# Patient Record
Sex: Male | Born: 2000 | Race: Black or African American | Hispanic: No | Marital: Single | Smoking: Never smoker
Health system: Southern US, Community
[De-identification: ages and names within clinical notes are randomized; demographics above are authoritative.]

## PROBLEM LIST (undated history)

## (undated) DIAGNOSIS — J45909 Unspecified asthma, uncomplicated: Secondary | ICD-10-CM

## (undated) HISTORY — PX: SHOULDER ARTHROSCOPY: SHX128

---
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
## 2019-12-09 ENCOUNTER — Ambulatory Visit: Payer: BC Managed Care – PPO | Attending: Internal Medicine

## 2019-12-09 DIAGNOSIS — Z20822 Contact with and (suspected) exposure to covid-19: Secondary | ICD-10-CM

## 2019-12-10 LAB — NOVEL CORONAVIRUS, NAA: SARS-CoV-2, NAA: NOT DETECTED

## 2020-01-12 ENCOUNTER — Ambulatory Visit
Admission: EM | Admit: 2020-01-12 | Discharge: 2020-01-12 | Disposition: A | Discharge status: Home / Self Care | Payer: BC Managed Care – PPO | Attending: Urgent Care | Admitting: Urgent Care

## 2020-01-12 ENCOUNTER — Encounter: Payer: Self-pay | Admitting: Emergency Medicine

## 2020-01-12 ENCOUNTER — Ambulatory Visit (INDEPENDENT_AMBULATORY_CARE_PROVIDER_SITE_OTHER): Payer: BC Managed Care – PPO

## 2020-01-12 ENCOUNTER — Other Ambulatory Visit: Payer: Self-pay

## 2020-01-12 DIAGNOSIS — M79641 Pain in right hand: Secondary | ICD-10-CM

## 2020-01-12 DIAGNOSIS — W458XXA Other foreign body or object entering through skin, initial encounter: Secondary | ICD-10-CM | POA: Diagnosis not present

## 2020-01-12 DIAGNOSIS — S60551A Superficial foreign body of right hand, initial encounter: Secondary | ICD-10-CM | POA: Diagnosis not present

## 2020-01-12 DIAGNOSIS — S6991XA Unspecified injury of right wrist, hand and finger(s), initial encounter: Secondary | ICD-10-CM

## 2020-01-12 MED ORDER — BACITRACIN ZINC 500 UNIT/GM EX OINT: TOPICAL_OINTMENT | Freq: Once | CUTANEOUS | Status: DC

## 2020-01-12 NOTE — ED Triage Notes (Signed)
Patient states he was messing with a wood palette and got a splinter stuck in his right hand on the ring finger today.

## 2020-01-12 NOTE — Discharge Instructions (Addendum)
It was very nice seeing you today in clinic. Thank you for entrusting me with your care.   Keep wound clean and dry. Monitor for signs and symptoms of infection, which would include increased redness, swelling, streaking, drainage, pain, and the development of a fever.   Make arrangements to follow up with your regular doctor in 1 week for re-evaluation if not improving. If your symptoms/condition worsens, please seek follow up care either here or in the ER. Please remember, our [facility] providers are "right here with you" when you need [place].   Again, it was my pleasure to take care of you today. Thank you for choosing our clinic. I hope that you start to feel better quickly.   Quentin Mulling, MSN, APRN, FNP-C, CEN Advanced Practice Provider [facility]

## 2020-01-13 NOTE — ED Provider Notes (Signed)
[place]   Name: Christopher Dunlap DOB: [birth year] MRN: [id number] CSN: [id number] PCP: System, Pcp Not In  Arrival date and time:  01/12/20 1647  Chief Complaint:  Hand Injury   NOTE: Prior to seeing the patient today, I have reviewed the triage nursing documentation and vital signs. Clinical staff has updated patient's PMH/PSHx, current medication list, and drug allergies/intolerances to ensure comprehensive history available to assist in medical decision making.   History:   HPI: Christopher Dunlap is a 19 y.o. male who presents today with complaints of a puncture wound to his RIGHT hand. Patient reports that he was bored at work this afternoon and was messing around. Injury sustained when patient punched a wood pallet causing him to get splints in his hand. Patient with several superficial splinters to the dorsal aspect of his RIGHT hand and a puncture wound to the webspace in between his 3rd and 4th digits. Patient reports that he believes he has a retained splinter in place. He denies pain in the hand unless wound is touched. Mild bleeding noted. Patient cleansed wound prior to arrival. Due to the acute nature of the patient's wound, he has not taken anything to help with the associated pain. Pain is minimal and reported to be 1/10 in severity. Patient reporting that he is UTD on tetanus prophylaxis.   Past Medical History:  Diagnosis Date  . Asthma     Past Surgical History:  Procedure Laterality Date  . SHOULDER ARTHROSCOPY Right     History reviewed. No pertinent family history.  Social History   Tobacco Use  . Smoking status: Never Smoker  . Smokeless tobacco: Never Used  Substance Use Topics  . Alcohol use: No  . Drug use: No    There are no problems to display for this patient.   Home Medications:    No outpatient medications have been marked as taking for the 01/12/20 encounter [facility] Encounter).    Allergies:   Patient has no known allergies.  Review of Systems  (ROS):  Review of systems NEGATIVE unless otherwise noted in narrative H&P section.   Vital Signs: Today's Vitals   01/12/20 1703 01/12/20 1705 01/12/20 1844  BP:  (!) 158/90   Pulse:  78   Resp:  18   Temp:  98.4 F (36.9 C)   TempSrc:  Oral   SpO2:  100%   Weight: 180 lb (81.6 kg)    Height: 5\' 9"  (1.753 m)    PainSc: 1   1     Physical Exam: Physical Exam  Constitutional: He is oriented to person, place, and time and well-developed, well-nourished, and in no distress.  HENT:  Head: Normocephalic and atraumatic.  Eyes: Pupils are equal, round, and reactive to light.  Cardiovascular: Normal rate and intact distal pulses.  Pulmonary/Chest: Effort normal. No respiratory distress.  Musculoskeletal:     Right hand: Tenderness (overlying wound) present. No swelling, deformity or bony tenderness. Normal range of motion. Normal strength. Normal sensation. Normal capillary refill.       Hands:  Neurological: He is alert and oriented to person, place, and time. He has normal sensation and normal strength. Gait normal.  Skin: Skin is warm and dry. No rash noted. He is not diaphoretic.  Psychiatric: Mood, memory, affect and judgment normal.  Nursing note and vitals reviewed.   Urgent Care Treatments / Results:   Orders Placed This Encounter  Procedures  . ED FOREIGN BODY REMOVAL  . DG Hand Complete Right  LABS: PLEASE NOTE: all labs that were ordered this encounter are listed, however only abnormal results are displayed. Labs Reviewed - No data to display  EKG: -None  RADIOLOGY: DG Hand Complete Right  Result Date: 01/12/2020 CLINICAL DATA:  Punched a Pallet. Puncture wound to base of fourth digit. Evaluate for radiopaque foreign body. EXAM: RIGHT HAND - COMPLETE 3+ VIEW COMPARISON:  None. FINDINGS: There is no evidence of fracture or dislocation. There is no evidence of arthropathy or other focal bone abnormality. No radiopaque foreign body. No soft tissue air. Soft  tissues are unremarkable. IMPRESSION: Negative radiographs of the right hand. No radiopaque foreign body or fracture. Please note, wood is not always radiopaque, if there is persistent clinical concern for foreign body, consider ultrasound evaluation. Electronically Signed   By: Narda Rutherford M.D.   On: 01/12/2020 18:02    PROCEDURES: Foreign Body Removal Performed by: Verlee Monte, NP Authorized by: Verlee Monte, NP   Consent:    Consent obtained:  Verbal   Consent given by:  Patient   Risks discussed:  Bleeding, infection, pain and incomplete removal   Alternatives discussed:  Observation, alternative treatment and delayed treatment Location:    Location:  Hand   Hand location:  R hand dorsum   Depth:  Intradermal   Tendon involvement:  None Pre-procedure details:    Imaging:  X-ray   Neurovascular status: intact   Anesthesia (see MAR for exact dosages):    Anesthesia method:  None Procedure type:    Procedure complexity:  Simple Procedure details:    Localization method:  Visualized   Dissection of underlying tissues: no     Bloodless field: no     Removal mechanism:  Forceps and irrigation   Foreign bodies recovered:  3   Description:  Small splinters of wood   Intact foreign body removal: yes   Post-procedure details:    Neurovascular status: intact     Confirmation:  No additional foreign bodies on visualization   Skin closure:  None   Dressing:  Antibiotic ointment and non-adherent dressing   Patient tolerance of procedure:  Tolerated well, no immediate complications    MEDICATIONS RECEIVED THIS VISIT: Medications - No data to display  PERTINENT CLINICAL COURSE NOTES/UPDATES:   Initial Impression / Assessment and Plan / Urgent Care Course:  Pertinent labs & imaging results that were available during my care of the patient were personally reviewed by me and considered in my medical decision making (see lab/imaging section of note for values and  interpretations).  Christopher Dunlap is a 19 y.o. male who presents to [facility] today with complaints of Hand Injury  Patient is well appearing overall in clinic today. He does not appear to be in any acute distress. Presenting symptoms (see HPI) and exam as documented above Patient with concern about retained FB in his RIGHT hand after punching a wood pallet today. Wound cleansed and explored with the use of forceps. Several small splinters removed. Patient remains concerned about potential for deeper retained FB. Discussed that FB may or may not show up on diagnostic radiographs. Reviewed that wood is not always radiopaque. Patient wishes to proceed with radiographs.   Diagnostic radiographs of the RIGHT hand revealed no acute abnormalities; no fracture, dislocation, or effusion. There were no radiopaque foreign bodies noted on the study. Radiologist noted that if clinical concern remained, US imaging of the soft tissues could be considered. Results reviewed with patient. Wound explored a second time  with assistance from clinic nursing staff. No further FB identified on exam. Discussed plans for conservative management at this point. Patient to monitor area for the next few days. If not improving, we can consider proceeding with the US imaging. Wound cleansed and dressed by CMA; bacitracin applied. Patient educated on need for daily wound care. He was encouraged to keep wound clean and dry. He was instructed to apply antibiotic ointment 1-2 times daily. Wound may be left open to air while at home, however he was encouraged to cover area while in public to prevent infection. Patient to monitor for signs and symptoms of infection, which would include increased redness, swelling, streaking, drainage, pain, and the development of a fever. May use Tylenol and/or Ibuprofen as needed for discomfort.   Discussed follow up with primary care physician in 1 week for re-evaluation. I have reviewed the follow up  and strict return precautions for any new or worsening symptoms. Patient is aware of symptoms that would be deemed urgent/emergent, and would thus require further evaluation either here or in the emergency department. At the time of discharge, he verbalized understanding and consent with the discharge plan as it was reviewed with him. All questions were fielded by provider and/or clinic staff prior to patient discharge.    Final Clinical Impressions / Urgent Care Diagnoses:   Final diagnoses:  Injury of right hand, initial encounter  Splinter of right hand    New Prescriptions:  [place] Controlled Substance Registry consulted? Not Applicable  Meds ordered this encounter  Medications  . bacitracin ointment    Recommended Follow up Care:  Patient encouraged to follow up with the following provider within the specified time frame, or sooner as dictated by the severity of his symptoms. As always, he was instructed that for any urgent/emergent care needs, he should seek care either here or in the emergency department for more immediate evaluation.  NOTE: This note was prepared using Lobbyist along with smaller Company secretary. Despite my best ability to proofread, there is the potential that transcriptional errors may still occur from this process, and are completely unintentional.    Karen Kitchens, NP 01/13/20 [phone]

## 2022-01-28 IMAGING — CR DG HAND COMPLETE 3+V*R*
3 series · 3 of 3 positions shown · non-contrast
Comparison: None.

CLINICAL DATA: Punched a Pallet. Puncture wound to base of fourth
digit. Evaluate for radiopaque foreign body.

EXAM:
RIGHT HAND - COMPLETE 3+ VIEW

[hand ap]
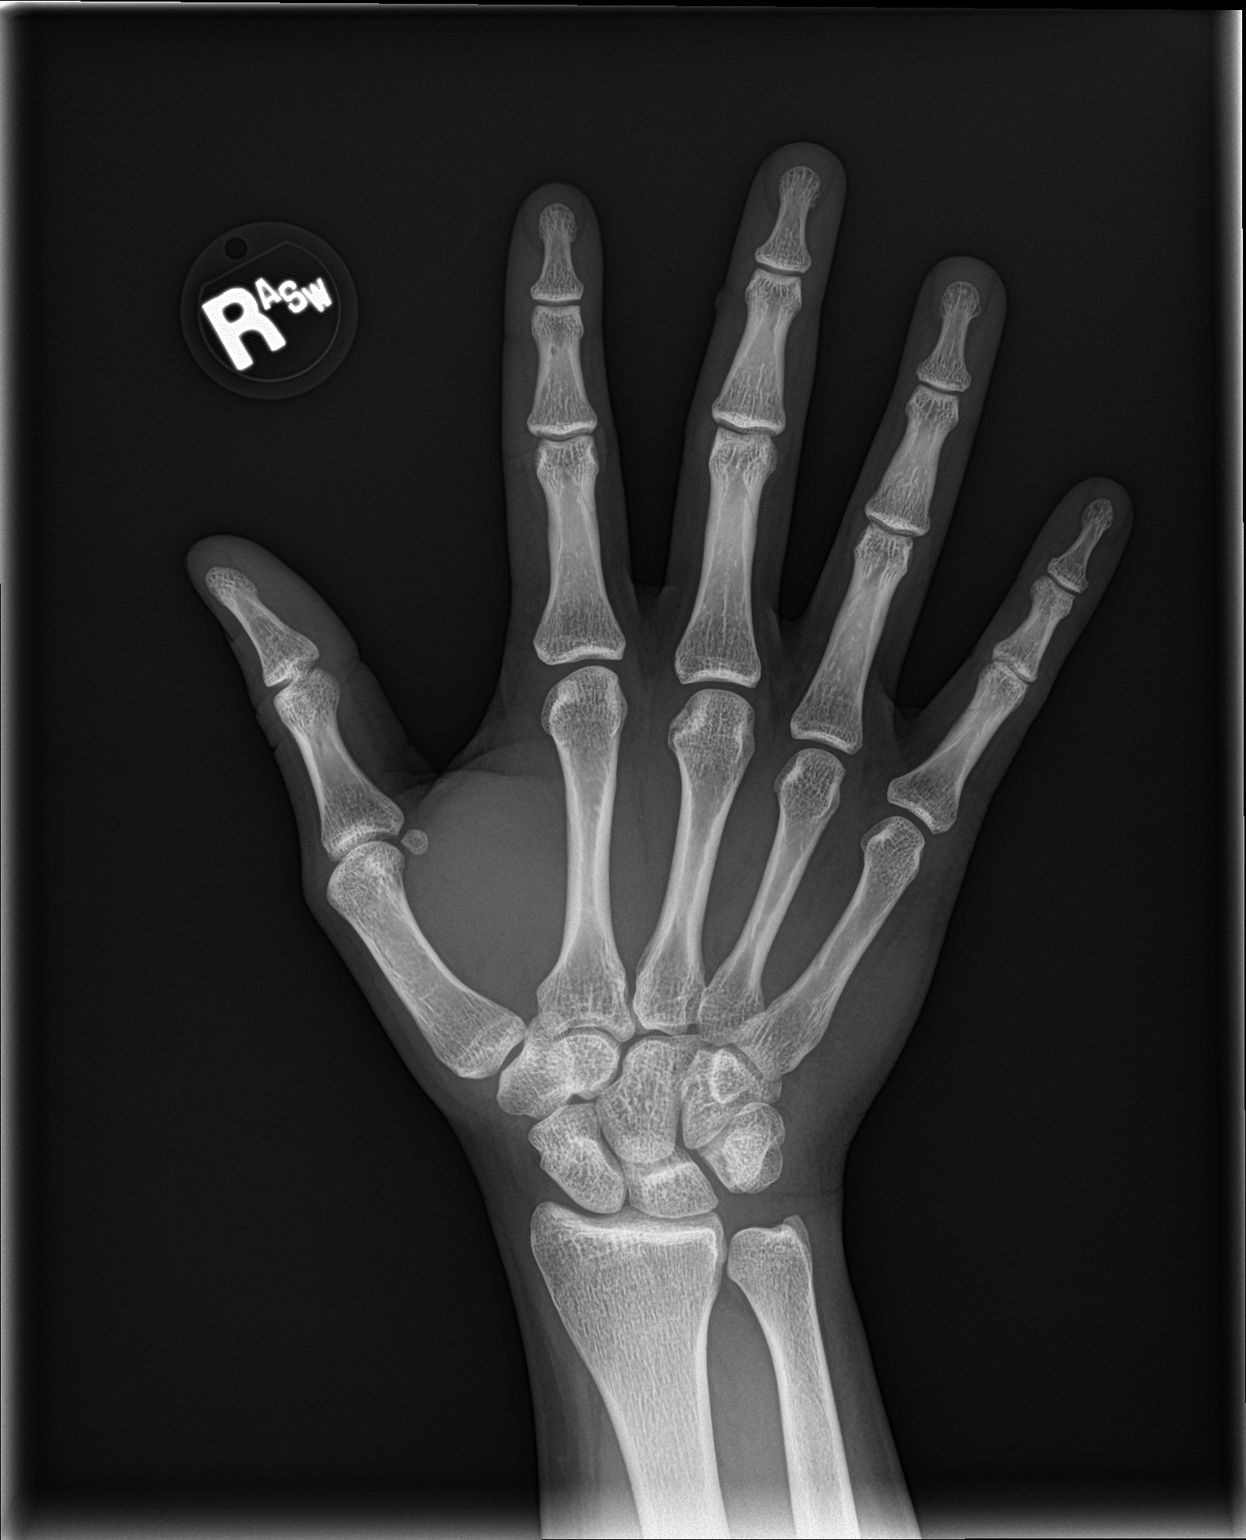

[hand obl]
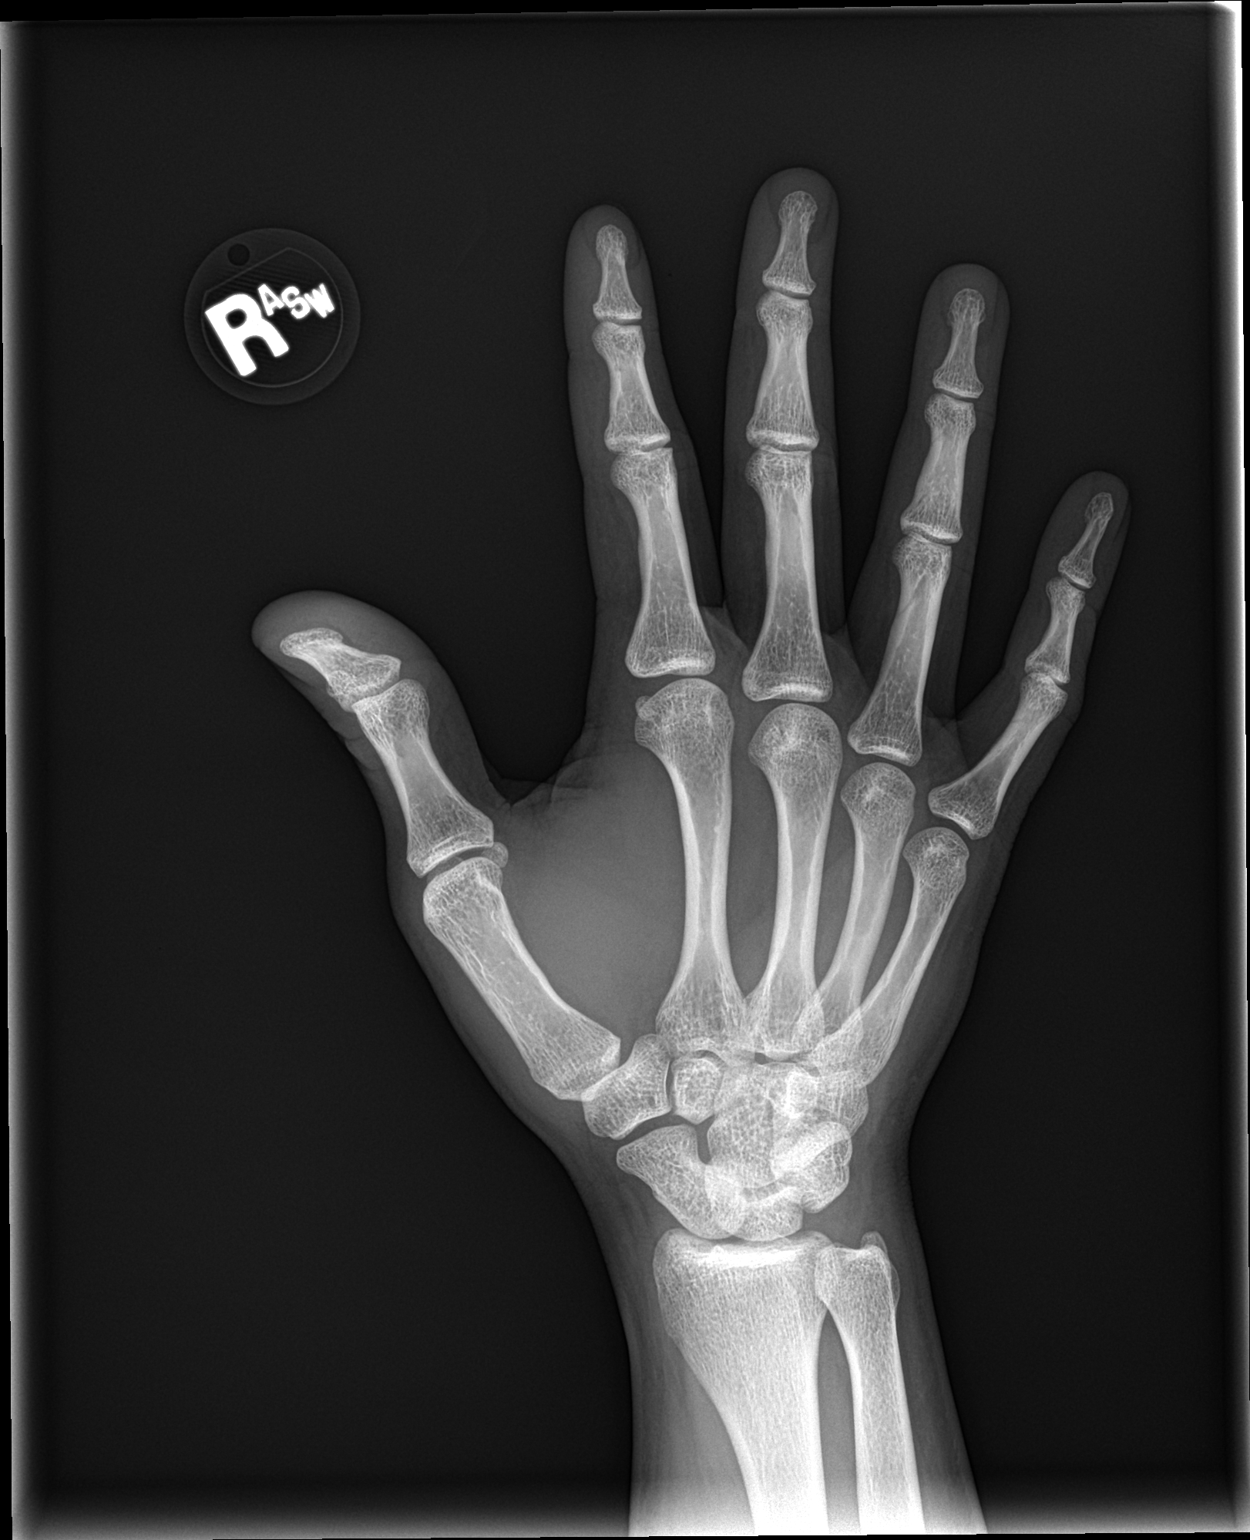

[hand lat]
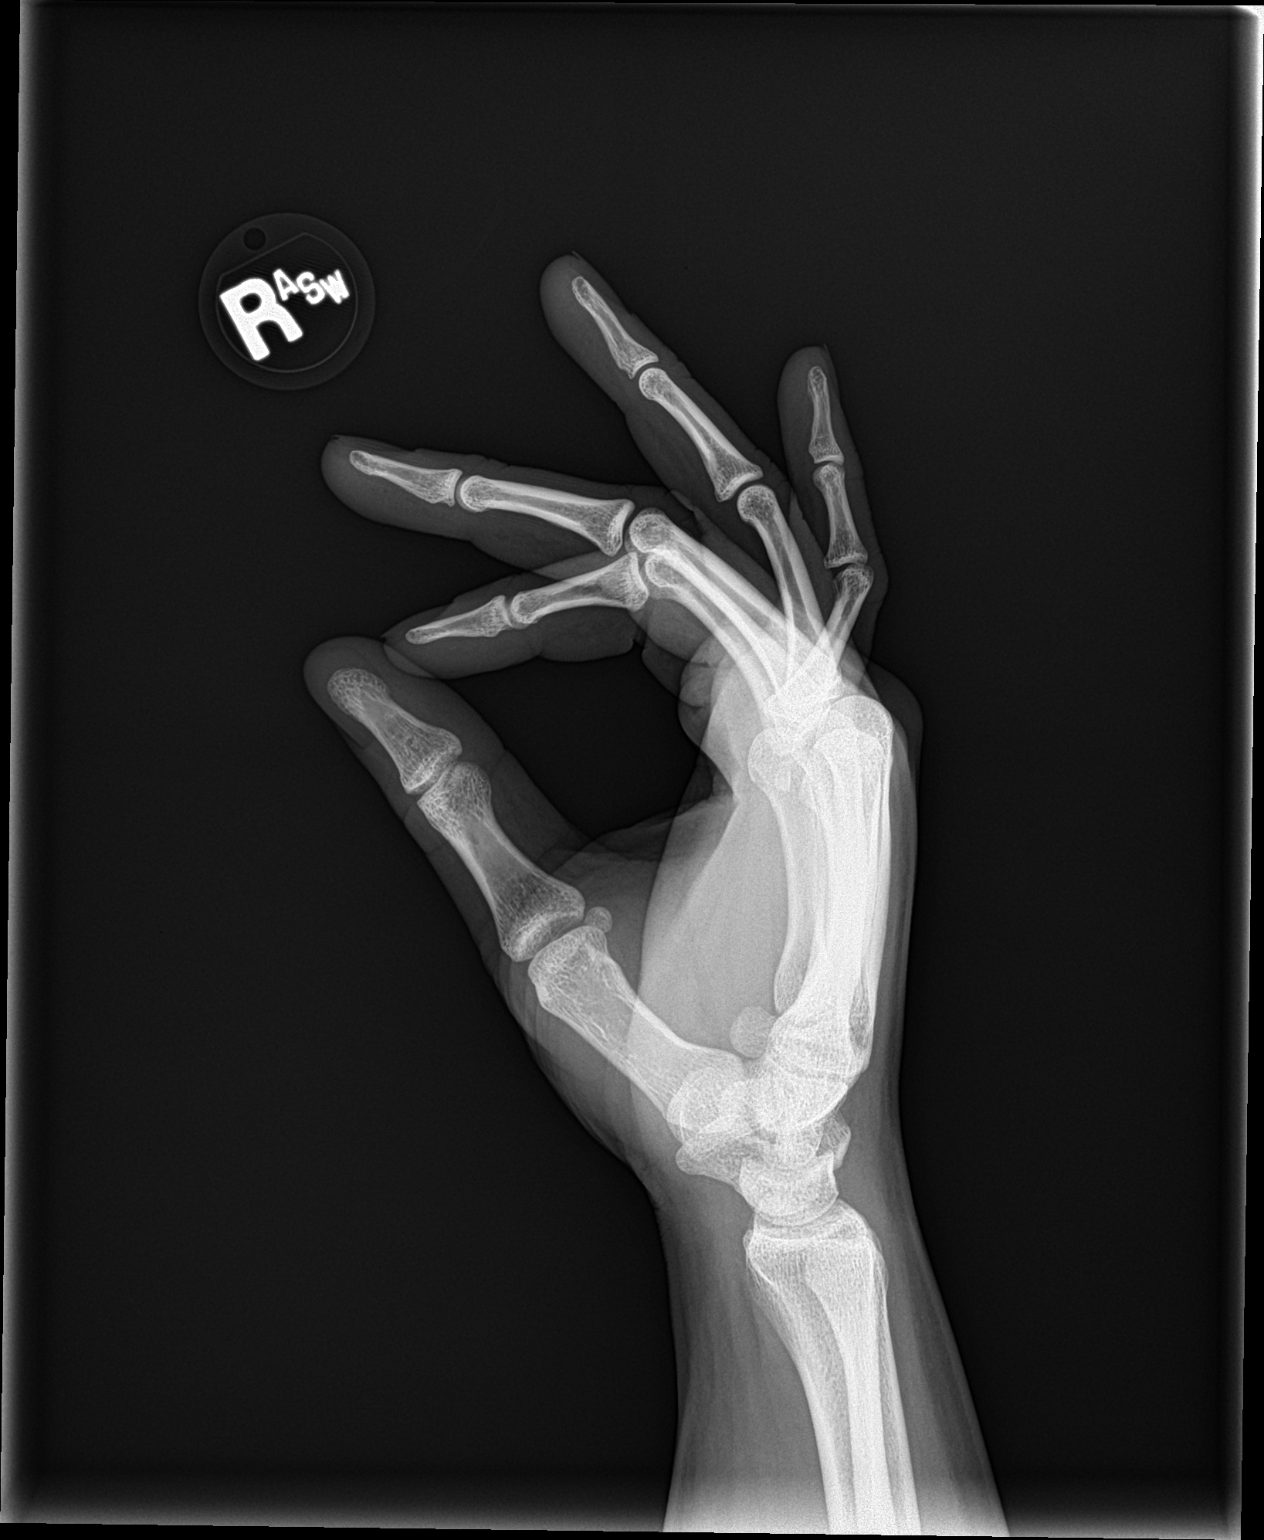

[3 of 3 positions shown; findings below may reference images not displayed]

FINDINGS: There is no evidence of fracture or dislocation. There is no
evidence of arthropathy or other focal bone abnormality. No
radiopaque foreign body. No soft tissue air. Soft tissues are
unremarkable.
IMPRESSION: Negative radiographs of the right hand. No radiopaque foreign body
or fracture. Please note, [place] is not always radiopaque, if there is
persistent clinical concern for foreign body, consider ultrasound
evaluation.
